# Patient Record
Sex: Female | Born: 2005 | Race: White | Hispanic: No | Marital: Single | Smoking: Never smoker
Health system: Southern US, Community
[De-identification: ages and names within clinical notes are randomized; demographics above are authoritative.]

## PROBLEM LIST (undated history)

## (undated) DIAGNOSIS — J45909 Unspecified asthma, uncomplicated: Secondary | ICD-10-CM

## (undated) DIAGNOSIS — E669 Obesity, unspecified: Secondary | ICD-10-CM

## (undated) DIAGNOSIS — K8020 Calculus of gallbladder without cholecystitis without obstruction: Secondary | ICD-10-CM

## (undated) DIAGNOSIS — K921 Melena: Secondary | ICD-10-CM

## (undated) DIAGNOSIS — R1013 Epigastric pain: Secondary | ICD-10-CM

## (undated) DIAGNOSIS — K5909 Other constipation: Secondary | ICD-10-CM

## (undated) DIAGNOSIS — R7303 Prediabetes: Secondary | ICD-10-CM

## (undated) HISTORY — DX: Melena: K92.1

## (undated) HISTORY — DX: Epigastric pain: R10.13

## (undated) HISTORY — DX: Other constipation: K59.09

---
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
## 2024-04-02 NOTE — Progress Notes (Signed)
 Chief Complaint:   Deborah Wang is a 18 y.o. [id number] female here for Follow-up (3 mo ocp check)   Referring provider: Verdon Heather Ruts*  History of Present Illness:   History of Present Illness Deborah Wang is an 18 year old female who presents with issues related to birth control patch adherence and effectiveness.  She experiences difficulties with the birth control patch, which pills up and detaches during showers or when she is hot and sweaty. Attempts to apply the patch to different areas, such as her arm and buttocks, have not improved adherence.  After discontinuing the patch, she had a spontaneous menstrual period, which was unexpected but welcome. This period occurred at a time that aligns with her typical cycle off birth control.  She wishes to resume the oral contraceptive pill, Junel, which she has used previously without experiencing headaches. Her current consistent schedule is expected to support adherence to the daily regimen. No recent headaches have been noted.      Results    See prior note for PCOS/irregular periods hx.  Sees pediatric endocrinology  Pertinent Hx: - Menarche: 9 - Cycles unpredictable, every 2-3 months. Period length: 4-7 days  - 12/2020 started on Loestrin to manage irregular periods - S/p dx laparoscopy and removal of bilateral paratubal cysts 01/2020 - S/p dx laparoscopy and removal of right paratubal cysts 02/2021   - PCOS - Hyperinsulinemia: 01/2020: 140 (H)  Birth control hx: -  - 12/2020 started on Loestrin to manage irregular periods - trial of patch in spring 2025 but would not stay on skin - return to Junel in 03/2024  Past Medical History:  has a past medical history of Acanthosis nigricans, Allergy (NA), Asthma, unspecified asthma severity, unspecified whether complicated, unspecified whether persistent (HHS-HCC), Constipation, Diabetes (CMS/HHS-HCC), GERD (gastroesophageal reflux disease) (NA), Hematochezia,  Hyperandrogenism, Hyperinsulinemia, and Vitamin D deficiency.  Past Surgical History:  has a past surgical history that includes tonsilectomy; Unlisted Procedure Pharynx/Adenoids/Tonsils; wisdom teeth; Cystectomy (02/10/2020); laparoscopic paratubal cyst (02/2021); and Tonsillectomy (NA). Family History: family history includes Asthma in her brother; Colon cancer in her maternal grandfather; Coronary Artery Disease (Blocked arteries around heart) in her paternal aunt; Diabetes in her maternal grandmother; Diabetes type II in her paternal grandmother; Heart disease in her brother, maternal grandfather, and mother; High blood pressure (Hypertension) in her father and mother; Lung cancer in her maternal grandfather; Osteoarthritis in her maternal grandmother; Rheum arthritis in her paternal grandmother; Stroke in her maternal aunt; Thyroid disease in her brother, father, maternal grandmother, and mother. Social History:  reports that she has never smoked. She has never used smokeless tobacco. She reports that she does not drink alcohol and does not use drugs. OB/GYN History:  OB History     Gravida  0   Para  0   Term  0   Preterm  0   AB  0   Living  0      SAB  0   IAB  0   Ectopic  0   Molar  0   Multiple  0   Live Births  0         Allergies: is allergic to amoxicillin. Medications: Current Outpatient Medications:  .  acetaminophen  (TYLENOL ) 325 MG tablet, Take by mouth every 6 (six) hours as needed, Disp: , Rfl:  .  norethindrone-ethinyl estradiol (JUNEL 1/20, 21,) 1-20 mg-mcg tablet, Take 1 tablet by mouth once daily FOR 21 DAYS NO DRUG FOR 7 DAYS THEN START NEW  PACK, Disp: 84 tablet, Rfl: 4 .  ACCU-CHEK FASTCLIX LANCET DRUM, USE TO CHECK GLUCOSE UP TO 8 TIMES A DAY. (Patient not taking: Reported on 04/02/2024), Disp: , Rfl:  .  ACCU-CHEK FASTCLIX LANCING DEV kit, USE WITH LANCETS FOR GLUCOSE CHECKS. PLEASE PROVIDE ONE FOR HOME AND ONE FOR SCHOOL. (Patient not taking:  Reported on 04/02/2024), Disp: , Rfl:  .  ACCU-CHEK GUIDE ME GLUCOSE MTR Misc, as directed (Patient not taking: Reported on 04/02/2024), Disp: , Rfl:  .  alcohol swabs PadM, Use as directed for glucose checks and insulin injections. (Patient not taking: Reported on 04/02/2024), Disp: , Rfl:  .  BD NANO 2ND GEN PEN NEEDLE 32 gauge x 5/32 Ndle, USE FOR DAILY INJECTION OF LIRAGLUTIDE (Patient not taking: Reported on 12/10/2023), Disp: , Rfl:  .  blood glucose diagnostic test strip, Use to check blood glucose 8 times a day. (Patient not taking: Reported on 04/02/2024), Disp: , Rfl:  .  blood glucose meter kit, Use as instructed to check blood sugar up to 8 times a day. Please provide one for home and one for school. (Patient not taking: Reported on 04/02/2024), Disp: , Rfl:  .  cyclobenzaprine  (FLEXERIL ) 5 MG tablet, Take 5 mg by mouth 3 (three) times daily as needed for Muscle spasms (Patient not taking: Reported on 04/02/2024), Disp: , Rfl:  .  metFORMIN (GLUCOPHAGE) 500 MG tablet, Take 1 tablet (500 mg total) by mouth once daily (Patient not taking: Reported on 11/29/2022), Disp: 90 tablet, Rfl: 3 .  norelgestromin-ethinyl estradiol (ORTHO EVRA) 150-35 mcg/24 hr patch, Place 1 patch onto the skin once a week (Patient not taking: Reported on 04/02/2024), Disp: 4 patch, Rfl: 11 .  ondansetron  (ZOFRAN -ODT) 4 MG disintegrating tablet, TAKE 1 TAB BY MOUTH EVERY 8 HOURS AS NEEDED FOR NAUSEA AND VOMITING (Patient not taking: Reported on 12/10/2023), Disp: , Rfl:  .  triamcinolone 0.1 % ointment, APPLY TO RASH ON HAND TWICE A DAY UNTIL RESOLVED. DO NOT APPLY TO FACE/GROIN/UNDERARMS. (Patient not taking: Reported on 12/10/2023), Disp: , Rfl:  .  VICTOZA 2-PAK 0.6 mg/0.1 mL (18 mg/3 mL) pen injector, INJECT 0.1 ML (0.6 MG) UNDER THE SKIN DAILY FOR 14 DAYS, THEN 0.2 ML (1.2 MG) DAILY FOR 14 DAYS (Patient not taking: Reported on 12/10/2023), Disp: , Rfl:   Review of Systems: No SOB, no palpitations or chest pain, no new lower  extremity edema, no nausea or vomiting or bowel or bladder complaints. See HPI for gyn specific ROS.   Exam:   Vitals:   04/02/24 1150  BP: 124/78     Constitutional:  General appearance: Well nourished, well developed female in no acute distress.  Neuro/psych:  Normal mood and affect. No gross motor deficits. Neck:  Supple, normal appearance.  Respiratory:  Normal respiratory effort, no use of accessory muscles Skin:  No visible rashes or external lesions       Impression:   The primary encounter diagnosis was Encounter for BCP (birth control pills) initial prescription. A diagnosis of PCOS (polycystic ovarian syndrome) was also pertinent to this visit.  Plan:   -  Assessment & Plan Contraceptive management She had issues with the transdermal patch and prefers to return to Junel 1/20 oral contraceptive pill. She understands the missed dose protocol and the need for additional STD protection. - Switch to Junel 1/20 oral contraceptive pill. - Instruct on proper use, including missed dose protocol. - Advise condom use for a week if pills are missed and she  has intercourse with a female partner. - Discuss lack of STD protection with the pill.  Menstrual cycle regulation She experienced a spontaneous menstrual period after discontinuing the patch, indicating some natural cycle regulation. - Monitor menstrual cycle on Junel 1/20 for regularity and issues.  Follow-up Follow-up in three months to evaluate response to contraceptive change and overall well-being. - Schedule follow-up in three months, with video visit option.    -   -   Diagnoses and all orders for this visit:  Encounter for BCP (birth control pills) initial prescription  PCOS (polycystic ovarian syndrome) -     norethindrone-ethinyl estradiol (JUNEL 1/20, 21,) 1-20 mg-mcg tablet; Take 1 tablet by mouth once daily FOR 21 DAYS NO DRUG FOR 7 DAYS THEN START NEW PACK     Return in about 3 months (around  07/03/2024) for Pill check.  BETHANY EVANGELINE BEASLEY, MD

## 2024-04-07 ENCOUNTER — Emergency Department
Admission: EM | Admit: 2024-04-07 | Discharge: 2024-04-07 | Disposition: A | Discharge status: Home / Self Care | Attending: Emergency Medicine | Admitting: Emergency Medicine

## 2024-04-07 ENCOUNTER — Other Ambulatory Visit: Payer: Self-pay

## 2024-04-07 DIAGNOSIS — U071 COVID-19: Secondary | ICD-10-CM | POA: Insufficient documentation

## 2024-04-07 DIAGNOSIS — R509 Fever, unspecified: Secondary | ICD-10-CM | POA: Diagnosis present

## 2024-04-07 DIAGNOSIS — E871 Hypo-osmolality and hyponatremia: Secondary | ICD-10-CM | POA: Diagnosis not present

## 2024-04-07 DIAGNOSIS — R000 Tachycardia, unspecified: Secondary | ICD-10-CM | POA: Diagnosis not present

## 2024-04-07 LAB — CBC WITH DIFFERENTIAL/PLATELET
Abs Immature Granulocytes: 0.06 10*3/uL (ref 0.00–0.07)
Basophils Absolute: 0.1 10*3/uL (ref 0.0–0.1)
Basophils Relative: 1 %
Eosinophils Absolute: 0 10*3/uL (ref 0.0–0.5)
Eosinophils Relative: 0 %
HCT: 42.2 % (ref 36.0–46.0)
Hemoglobin: 13.4 g/dL (ref 12.0–15.0)
Immature Granulocytes: 1 %
Lymphocytes Relative: 6 %
Lymphs Abs: 0.6 10*3/uL — ABNORMAL LOW (ref 0.7–4.0)
MCH: 25.1 pg — ABNORMAL LOW (ref 26.0–34.0)
MCHC: 31.8 g/dL (ref 30.0–36.0)
MCV: 79.2 fL — ABNORMAL LOW (ref 80.0–100.0)
Monocytes Absolute: 0.8 10*3/uL (ref 0.1–1.0)
Monocytes Relative: 8 %
Neutro Abs: 8.6 10*3/uL — ABNORMAL HIGH (ref 1.7–7.7)
Neutrophils Relative %: 84 %
Platelets: 285 10*3/uL (ref 150–400)
RBC: 5.33 MIL/uL — ABNORMAL HIGH (ref 3.87–5.11)
RDW: 13.6 % (ref 11.5–15.5)
WBC: 10.2 10*3/uL (ref 4.0–10.5)
nRBC: 0 % (ref 0.0–0.2)

## 2024-04-07 LAB — COMPREHENSIVE METABOLIC PANEL WITH GFR
ALT: 43 U/L (ref 0–44)
AST: 30 U/L (ref 15–41)
Albumin: 4.2 g/dL (ref 3.5–5.0)
Alkaline Phosphatase: 98 U/L (ref 38–126)
Anion gap: 10 (ref 5–15)
BUN: 9 mg/dL (ref 6–20)
CO2: 23 mmol/L (ref 22–32)
Calcium: 9.2 mg/dL (ref 8.9–10.3)
Chloride: 100 mmol/L (ref 98–111)
Creatinine, Ser: 0.64 mg/dL (ref 0.44–1.00)
GFR, Estimated: >60 mL/min (ref >60)
Glucose, Bld: 103 mg/dL — ABNORMAL HIGH (ref 70–99)
Potassium: 4 mmol/L (ref 3.5–5.1)
Sodium: 133 mmol/L — ABNORMAL LOW (ref 135–145)
Total Bilirubin: 0.7 mg/dL (ref 0.0–1.2)
Total Protein: 8.4 g/dL — ABNORMAL HIGH (ref 6.5–8.1)

## 2024-04-07 LAB — GROUP A STREP BY PCR: Group A Strep by PCR: NOT DETECTED

## 2024-04-07 LAB — RESP PANEL BY RT-PCR (RSV, FLU A&B, COVID)  RVPGX2
Influenza A by PCR: NEGATIVE
Influenza B by PCR: NEGATIVE
Resp Syncytial Virus by PCR: NEGATIVE
SARS Coronavirus 2 by RT PCR: POSITIVE — AB

## 2024-04-07 MED ORDER — ACETAMINOPHEN 500 MG PO TABS
1000.0000 mg | ORAL_TABLET | Freq: Once | ORAL | Status: AC
  Administered 2024-04-07: 1000 mg via ORAL
  Filled 2024-04-07: qty 2

## 2024-04-07 MED ORDER — IBUPROFEN 600 MG PO TABS
600.0000 mg | ORAL_TABLET | Freq: Once | ORAL | Status: AC
  Administered 2024-04-07: 600 mg via ORAL
  Filled 2024-04-07: qty 1

## 2024-04-07 MED ORDER — ONDANSETRON 4 MG PO TBDP: 4.0000 mg | ORAL_TABLET | Freq: Three times a day (TID) | ORAL | 0 refills | Status: AC | PRN

## 2024-04-07 MED ORDER — ONDANSETRON 4 MG PO TBDP
4.0000 mg | ORAL_TABLET | Freq: Once | ORAL | Status: AC
  Administered 2024-04-07: 4 mg via ORAL
  Filled 2024-04-07: qty 1

## 2024-04-07 NOTE — ED Notes (Signed)
Dr Arnoldo Morale with pt

## 2024-04-07 NOTE — ED Triage Notes (Signed)
 PT comes in via pov with complaints of emesis that started today. Pt states that she started off with a sore throat Sunday night, and started having a cough Sunday night, and began having vomiting this morning. Pt also complains of chills that come and go. Pt with no fever in triage. Pt tachycardic with HR 133.

## 2024-04-07 NOTE — ED Notes (Signed)
 Pt reports a sore throat and headache, cough.    No headache now. No fever.  Pt alert.  Pt in hallway bed.  Family with pt.

## 2024-04-07 NOTE — Discharge Instructions (Addendum)
 You are seen in emergency department for not feeling well with sore throat.  You tested positive for COVID.  You had a temperature of 103 in the emergency department.  You were given Tylenol  and Zofran .  It is important that you stay hydrated and drink plenty of fluids.  Hydrate orally with Pedialyte/Gatorade/Powerade for electrolytes.  You are given a prescription for nausea medication.  Return to the emergency department for any worsening symptoms.  Pain control:  Ibuprofen  (motrin /aleve/advil ) - You can take 3 tablets (600 mg) every 6 hours as needed for pain/fever.  Acetaminophen  (tylenol ) - You can take 2 extra strength tablets (1000 mg) every 6 hours as needed for pain/fever.  You can alternate these medications or take them together.  Make sure you eat food/drink water when taking these medications.

## 2024-04-07 NOTE — ED Notes (Signed)
 Meds given  pt drinking po  fluids.  Pt has a mask on.

## 2024-04-07 NOTE — ED Provider Notes (Signed)
 [facility] Provider Note    Event Date/Time   First MD Initiated Contact with Patient 04/07/24 1701     (approximate)   History   No chief complaint on file.   HPI  Deborah Wang is a 18 y.o. female presents to the emergency department for not feeling well.  States that she started having a headache on Sunday.  Then having a significant sore throat with some nausea and vomiting.  Denies any abdominal pain or diarrhea.  No dysuria.  Denies any concern for pregnancy.  Denies any significant cough or shortness of breath.     Physical Exam   Triage Vital Signs: ED Triage Vitals [04/07/24 1623]  Encounter Vitals Group     BP 121/70     Girls Systolic BP Percentile      Girls Diastolic BP Percentile      Boys Systolic BP Percentile      Boys Diastolic BP Percentile      Pulse Rate (!) 134     Resp 17     Temp 98.5 F (36.9 C)     Temp src      SpO2 98 %     Weight 283 lb (128.4 kg)     Height 5' 5 (1.651 m)     Head Circumference      Peak Flow      Pain Score 0     Pain Loc      Pain Education      Exclude from Growth Chart     Most recent vital signs: Vitals:   04/07/24 1830 04/07/24 1847  BP: 113/71   Pulse: (!) 115   Resp: 16   Temp:  (!) 102 F (38.9 C)  SpO2: 97%     Physical Exam Constitutional:      Appearance: She is well-developed.  HENT:     Head: Atraumatic.     Mouth/Throat:     Mouth: Mucous membranes are moist.     Pharynx: Oropharynx is clear.     Tonsils: No tonsillar exudate or tonsillar abscesses.   Eyes:     Conjunctiva/sclera: Conjunctivae normal.    Cardiovascular:     Rate and Rhythm: Regular rhythm. Tachycardia present.  Pulmonary:     Effort: No respiratory distress.  Abdominal:     General: There is no distension.     Tenderness: There is no abdominal tenderness.   Musculoskeletal:        General: Normal range of motion.     Cervical back: Normal range of motion.   Skin:    General:  Skin is warm.   Neurological:     Mental Status: She is alert. Mental status is at baseline.     IMPRESSION / MDM / ASSESSMENT AND PLAN / ED COURSE  I reviewed the triage vital signs and the nursing notes.  Differential diagnosis including viral pharyngitis, strep throat, viral illness including COVID/influenza, gastroenteritis, dehydration, electrolyte abnormality.  On arrival patient was afebrile and significantly tachycardic.  Repeated temperature and temperature was 103 with a heart rate of 122 with some tachypnea.   EKG  I, Clotilda Punter, the attending physician, personally viewed and interpreted this ECG.  Sinus tachycardia with a heart rate of 129.  Normal intervals.  No chamber enlargement.  No significant ST elevation or depression.  No findings of acute ischemia or dysrhythmia.  Sinus tachycardia while on cardiac telemetry.  LABS (all labs ordered are listed, but only abnormal  results are displayed) Labs interpreted as -    Labs Reviewed  RESP PANEL BY RT-PCR (RSV, FLU A&B, COVID)  RVPGX2 - Abnormal; Notable for the following components:      Result Value   SARS Coronavirus 2 by RT PCR POSITIVE (*)    All other components within normal limits  CBC WITH DIFFERENTIAL/PLATELET - Abnormal; Notable for the following components:   RBC 5.33 (*)    MCV 79.2 (*)    MCH 25.1 (*)    Neutro Abs 8.6 (*)    Lymphs Abs 0.6 (*)    All other components within normal limits  COMPREHENSIVE METABOLIC PANEL WITH GFR - Abnormal; Notable for the following components:   Sodium 133 (*)    Glucose, Bld 103 (*)    Total Protein 8.4 (*)    All other components within normal limits  GROUP A STREP BY PCR     MDM  COVID was positive.  Strep test was negative.  No significant leukocytosis.  Mild hyponatremia of 133 which is likely in the setting of volume depletion.  Given antipyretics and Zofran .  Will attempt oral hydration.  If continues to be tachycardic will give IV  fluids.  Continue to be febrile, given Motrin .  Patient states she is feeling much better.  She is tolerating p.o.  Gone to the bathroom multiple times.  States that she is feeling much better.  Continues to have some mild tachycardia and mild fever but otherwise is well-appearing.  States that she would return if she had any signs of dehydration.  Will call her in antiemetics.  Denies any burning with urination have low suspicion for urinary tract infection.  No questions at time of discharge.     PROCEDURES:  Critical Care performed: No  Procedures  Patient's presentation is most consistent with acute presentation with potential threat to life or bodily function.   MEDICATIONS ORDERED IN ED: Medications  acetaminophen  (TYLENOL ) tablet 1,000 mg (1,000 mg Oral Given 04/07/24 1739)  ondansetron  (ZOFRAN -ODT) disintegrating tablet 4 mg (4 mg Oral Given 04/07/24 1740)  ibuprofen  (ADVIL ) tablet 600 mg (600 mg Oral Given 04/07/24 1847)    FINAL CLINICAL IMPRESSION(S) / ED DIAGNOSES   Final diagnoses:  COVID  Fever, unspecified fever cause     Rx / DC Orders   ED Discharge Orders          Ordered    ondansetron  (ZOFRAN -ODT) 4 MG disintegrating tablet  Every 8 hours PRN        04/07/24 1736             Note:  This document was prepared using Dragon voice recognition software and may include unintentional dictation errors.   Suzanne Kirsch, MD 04/07/24 TYRA

## 2024-08-06 NOTE — Progress Notes (Signed)
 Chief Complaint:   Ms. Cavenaugh is a 18 y.o. G0P0000 female here for Follow-up (OCP follow up - feels it hasn't been any better than the patch because she forgets to take it some days, forgetting to take it twice a week at least )   Referring provider: Self  History of Present Illness:  Contraception: - orthoevra patch trial- did not stick to skin - oral OCPs Junel 1/20 in 03/2024 to regular period but she forgets  Hx of PCOS, prior metformin use,   History of Present Illness Deborah Wang is a 18 year old female who presents with concerns about contraception and mood changes.  Contraceptive management - Initial use of Ortho Evra patch, which failed to adhere to skin - Transitioned to Junel 1/20 oral contraceptive - Inconsistent use of oral contraceptive due to forgetfulness  Menstrual irregularity - Amenorrhea for seven years  Mood disturbance - Significant irritability and easy aggravation, partially attributed to stress - No depression - Normal sleep patterns  Access to mental health care - Attempted therapy but unable to find a therapist who accepts her insurance     Past Medical History:  has a past medical history of Acanthosis nigricans, Allergy (NA), Asthma, unspecified asthma severity, unspecified whether complicated, unspecified whether persistent (HHS-HCC), Constipation, Diabetes (CMS/HHS-HCC), GERD (gastroesophageal reflux disease) (NA), Hematochezia, Hyperandrogenism, Hyperinsulinemia, and Vitamin D deficiency.  Past Surgical History:  has a past surgical history that includes tonsilectomy; Unlisted Procedure Pharynx/Adenoids/Tonsils; wisdom teeth; Cystectomy (02/10/2020); laparoscopic paratubal cyst (02/2021); and Tonsillectomy (NA). Family History: family history includes Asthma in her brother; Colon cancer in her maternal grandfather; Coronary Artery Disease (Blocked arteries around heart) in her paternal aunt; Diabetes in her maternal grandmother; Diabetes type  II in her paternal grandmother; Heart disease in her brother, maternal grandfather, and mother; High blood pressure (Hypertension) in her father and mother; Lung cancer in her maternal grandfather; Osteoarthritis in her maternal grandmother; Rheum arthritis in her paternal grandmother; Stroke in her maternal aunt; Thyroid disease in her brother, father, maternal grandmother, and mother. Social History:  reports that she has never smoked. She has never used smokeless tobacco. She reports that she does not drink alcohol and does not use drugs. OB/GYN History:  OB History     Gravida  0   Para  0   Term  0   Preterm  0   AB  0   Living  0      SAB  0   IAB  0   Ectopic  0   Molar  0   Multiple  0   Live Births  0         Allergies: is allergic to amoxicillin. Medications: Current Outpatient Medications:  .  acetaminophen  (TYLENOL ) 325 MG tablet, Take by mouth every 6 (six) hours as needed, Disp: , Rfl:  .  norethindrone-ethinyl estradiol (JUNEL 1/20, 21,) 1-20 mg-mcg tablet, Take 1 tablet by mouth once daily FOR 21 DAYS NO DRUG FOR 7 DAYS THEN START NEW PACK, Disp: 84 tablet, Rfl: 4 .  metFORMIN (GLUCOPHAGE) 500 MG tablet, Take 1 tablet (500 mg total) by mouth once daily (Patient not taking: Reported on 11/29/2022), Disp: 90 tablet, Rfl: 3 .  sertraline (ZOLOFT) 50 MG tablet, Take 1/2 tablet daily x 1 week then increase to 1 tablet daily, Disp: 90 tablet, Rfl: 1  Review of Systems: No SOB, no palpitations or chest pain, no new lower extremity edema, no nausea or vomiting or bowel or bladder complaints. See  HPI for gyn specific ROS.   Exam:   Vitals:   08/06/24 1558  BP: 126/74     Constitutional:  General appearance: Well nourished, well developed female in no acute distress.  Neuro/psych:  Normal mood and affect. No gross motor deficits. Neck:  Supple, normal appearance.  Respiratory:  Normal respiratory effort, no use of accessory muscles Skin:  No visible  rashes or external lesions       Impression:   The primary encounter diagnosis was Irritability. A diagnosis of PCOS (polycystic ovarian syndrome) was also pertinent to this visit.  Plan:   -  Assessment & Plan Polycystic Ovarian Syndrome (PCOS) with secondary amenorrhea PCOS with secondary amenorrhea for seven years. Inducing a period at least four times a year is crucial to prevent endometrial hyperplasia and potential progression to endometrial cancer. The uterine lining needs periodic shedding to prevent unregulated growth. - Prescribe a 10-day course of progesterone to induce a period, to be taken at her convenience.  Contraceptive management Currently not on a consistent contraceptive method due to difficulty remembering to take oral contraceptives. Previously tried the patch, which did not adhere well. Interested in continuing with oral contraceptives but struggles with adherence. Concerned about the ring getting lost and potential weight gain with the shot. - Continue with oral contraceptive pills and set a consistent schedule for taking them, possibly using an alarm or associating with a daily routine like brushing teeth. - Provide a new prescription for oral contraceptives. - Consider the ring as a future option if adherence to pills remains challenging.  Mood symptoms (irritability) Experiencing increased irritability and snappiness, possibly related to stress and lack of personal time. No signs of depression. Potential role of neurotransmitter depletion due to stress. Difficulty finding a therapist due to insurance issues. - Provide an online resource to find therapists in her area. - Prescribe a medication for mood stabilization. Zoloft 50mg  - Instruct to keep a symptom diary to monitor mood changes and any side effects from medication. - Schedule a follow-up in two months to assess the effectiveness of the medication and mood improvement.     -   -   Diagnoses and all  orders for this visit:  Irritability -     sertraline (ZOLOFT) 50 MG tablet; Take 1/2 tablet daily x 1 week then increase to 1 tablet daily  PCOS (polycystic ovarian syndrome) -     norethindrone-ethinyl estradiol (JUNEL 1/20, 21,) 1-20 mg-mcg tablet; Take 1 tablet by mouth once daily FOR 21 DAYS NO DRUG FOR 7 DAYS THEN START NEW PACK     Return in about 3 months (around 11/06/2024) for follow up - med check.  BETHANY EVANGELINE BEASLEY, MD

## 2024-08-25 ENCOUNTER — Emergency Department

## 2024-08-25 ENCOUNTER — Encounter
Admission: EM | Disposition: A | Discharge status: Home / Self Care | Payer: Self-pay | Source: Home / Self Care | Attending: Emergency Medicine

## 2024-08-25 ENCOUNTER — Other Ambulatory Visit: Payer: Self-pay

## 2024-08-25 ENCOUNTER — Observation Stay
Admission: EM | Admit: 2024-08-25 | Discharge: 2024-08-26 | Disposition: A | Discharge status: Home / Self Care | Attending: General Surgery | Admitting: General Surgery

## 2024-08-25 ENCOUNTER — Observation Stay: Admitting: Anesthesiology

## 2024-08-25 DIAGNOSIS — K8012 Calculus of gallbladder with acute and chronic cholecystitis without obstruction: Secondary | ICD-10-CM | POA: Diagnosis not present

## 2024-08-25 DIAGNOSIS — J45909 Unspecified asthma, uncomplicated: Secondary | ICD-10-CM | POA: Insufficient documentation

## 2024-08-25 DIAGNOSIS — Z6841 Body Mass Index (BMI) 40.0 and over, adult: Secondary | ICD-10-CM | POA: Diagnosis not present

## 2024-08-25 DIAGNOSIS — K810 Acute cholecystitis: Principal | ICD-10-CM | POA: Diagnosis present

## 2024-08-25 DIAGNOSIS — R1011 Right upper quadrant pain: Secondary | ICD-10-CM | POA: Diagnosis present

## 2024-08-25 HISTORY — PX: INDOCYANINE GREEN FLUORESCENCE IMAGING (ICG): SHX7595

## 2024-08-25 LAB — URINALYSIS, ROUTINE W REFLEX MICROSCOPIC
Bilirubin Urine: NEGATIVE
Glucose, UA: NEGATIVE mg/dL
Ketones, ur: NEGATIVE mg/dL
Leukocytes,Ua: NEGATIVE
Nitrite: NEGATIVE
Protein, ur: 30 mg/dL — AB
Specific Gravity, Urine: 1.031 — ABNORMAL HIGH (ref 1.005–1.030)
pH: 6 (ref 5.0–8.0)

## 2024-08-25 LAB — COMPREHENSIVE METABOLIC PANEL WITH GFR
ALT: 39 U/L (ref 0–44)
AST: 30 U/L (ref 15–41)
Albumin: 3.8 g/dL (ref 3.5–5.0)
Alkaline Phosphatase: 92 U/L (ref 38–126)
Anion gap: 11 (ref 5–15)
BUN: 10 mg/dL (ref 6–20)
CO2: 22 mmol/L (ref 22–32)
Calcium: 8.9 mg/dL (ref 8.9–10.3)
Chloride: 106 mmol/L (ref 98–111)
Creatinine, Ser: 0.49 mg/dL (ref 0.44–1.00)
GFR, Estimated: >60 mL/min (ref >60)
Glucose, Bld: 122 mg/dL — ABNORMAL HIGH (ref 70–99)
Potassium: 3.8 mmol/L (ref 3.5–5.1)
Sodium: 139 mmol/L (ref 135–145)
Total Bilirubin: 0.4 mg/dL (ref 0.0–1.2)
Total Protein: 7.9 g/dL (ref 6.5–8.1)

## 2024-08-25 LAB — CBC
HCT: 40.5 % (ref 36.0–46.0)
Hemoglobin: 12.9 g/dL (ref 12.0–15.0)
MCH: 25.4 pg — ABNORMAL LOW (ref 26.0–34.0)
MCHC: 31.9 g/dL (ref 30.0–36.0)
MCV: 79.7 fL — ABNORMAL LOW (ref 80.0–100.0)
Platelets: 354 K/uL (ref 150–400)
RBC: 5.08 MIL/uL (ref 3.87–5.11)
RDW: 13.6 % (ref 11.5–15.5)
WBC: 14.4 K/uL — ABNORMAL HIGH (ref 4.0–10.5)
nRBC: 0 % (ref 0.0–0.2)

## 2024-08-25 LAB — LIPASE, BLOOD: Lipase: 40 U/L (ref 11–51)

## 2024-08-25 LAB — POC URINE PREG, ED: Preg Test, Ur: NEGATIVE

## 2024-08-25 SURGERY — CHOLECYSTECTOMY, ROBOT-ASSISTED, LAPAROSCOPIC
Anesthesia: General

## 2024-08-25 MED ORDER — ROCURONIUM BROMIDE 100 MG/10ML IV SOLN
INTRAVENOUS | Status: DC | PRN
  Administered 2024-08-25: 30 mg via INTRAVENOUS
  Administered 2024-08-25: 50 mg via INTRAVENOUS

## 2024-08-25 MED ORDER — PROPOFOL 10 MG/ML IV BOLUS
INTRAVENOUS | Status: AC
  Filled 2024-08-25: qty 20

## 2024-08-25 MED ORDER — OXYCODONE HCL 5 MG/5ML PO SOLN: 5.0000 mg | Freq: Once | ORAL | Status: AC | PRN

## 2024-08-25 MED ORDER — CIPROFLOXACIN IN D5W 400 MG/200ML IV SOLN
400.0000 mg | Freq: Two times a day (BID) | INTRAVENOUS | Status: DC
  Administered 2024-08-25 – 2024-08-26 (×3): 400 mg via INTRAVENOUS
  Filled 2024-08-25 (×4): qty 200

## 2024-08-25 MED ORDER — DEXMEDETOMIDINE HCL IN NACL 80 MCG/20ML IV SOLN
INTRAVENOUS | Status: DC | PRN
  Administered 2024-08-25: 12 ug via INTRAVENOUS
  Administered 2024-08-25: 4 ug via INTRAVENOUS
  Administered 2024-08-25: 8 ug via INTRAVENOUS
  Administered 2024-08-25 (×2): 4 ug via INTRAVENOUS

## 2024-08-25 MED ORDER — FENTANYL CITRATE (PF) 100 MCG/2ML IJ SOLN
INTRAMUSCULAR | Status: AC
  Filled 2024-08-25: qty 2

## 2024-08-25 MED ORDER — ONDANSETRON HCL 4 MG/2ML IJ SOLN
INTRAMUSCULAR | Status: AC
  Filled 2024-08-25: qty 2

## 2024-08-25 MED ORDER — SUCCINYLCHOLINE CHLORIDE 200 MG/10ML IV SOSY
PREFILLED_SYRINGE | INTRAVENOUS | Status: DC | PRN
  Administered 2024-08-25: 140 mg via INTRAVENOUS

## 2024-08-25 MED ORDER — ACETAMINOPHEN 10 MG/ML IV SOLN
INTRAVENOUS | Status: AC
  Filled 2024-08-25: qty 100

## 2024-08-25 MED ORDER — MORPHINE SULFATE (PF) 4 MG/ML IV SOLN
4.0000 mg | INTRAVENOUS | Status: DC | PRN
  Administered 2024-08-25: 4 mg via INTRAVENOUS
  Filled 2024-08-25: qty 1

## 2024-08-25 MED ORDER — OXYCODONE HCL 5 MG PO TABS
ORAL_TABLET | ORAL | Status: AC
  Filled 2024-08-25: qty 1

## 2024-08-25 MED ORDER — INDOCYANINE GREEN 25 MG IV SOLR
INTRAVENOUS | Status: AC
  Filled 2024-08-25: qty 10

## 2024-08-25 MED ORDER — PROPOFOL 10 MG/ML IV BOLUS
INTRAVENOUS | Status: DC | PRN
Start: 2024-08-25 — End: 2024-08-25
  Administered 2024-08-25: 20 mg via INTRAVENOUS
  Administered 2024-08-25: 250 mg via INTRAVENOUS
  Administered 2024-08-25: 20 ug/kg/min via INTRAVENOUS

## 2024-08-25 MED ORDER — CEFAZOLIN SODIUM-DEXTROSE 3-4 GM/150ML-% IV SOLN
3.0000 g | INTRAVENOUS | Status: AC
  Administered 2024-08-25: 3 g via INTRAVENOUS
  Filled 2024-08-25: qty 150

## 2024-08-25 MED ORDER — LIDOCAINE HCL (CARDIAC) PF 100 MG/5ML IV SOSY
PREFILLED_SYRINGE | INTRAVENOUS | Status: DC | PRN
  Administered 2024-08-25: 100 mg via INTRAVENOUS

## 2024-08-25 MED ORDER — DEXAMETHASONE SOD PHOSPHATE PF 10 MG/ML IJ SOLN
INTRAMUSCULAR | Status: DC | PRN
  Administered 2024-08-25: 10 mg via INTRAVENOUS

## 2024-08-25 MED ORDER — LACTATED RINGERS IV SOLN: INTRAVENOUS | Status: DC | PRN

## 2024-08-25 MED ORDER — MIDAZOLAM HCL 2 MG/2ML IJ SOLN
INTRAMUSCULAR | Status: AC
  Filled 2024-08-25: qty 2

## 2024-08-25 MED ORDER — CEFAZOLIN SODIUM-DEXTROSE 2-4 GM/100ML-% IV SOLN
INTRAVENOUS | Status: AC
  Filled 2024-08-25: qty 100

## 2024-08-25 MED ORDER — METRONIDAZOLE 500 MG/100ML IV SOLN
500.0000 mg | Freq: Two times a day (BID) | INTRAVENOUS | Status: DC
  Administered 2024-08-25 – 2024-08-26 (×3): 500 mg via INTRAVENOUS
  Filled 2024-08-25 (×4): qty 100

## 2024-08-25 MED ORDER — SUGAMMADEX SODIUM 200 MG/2ML IV SOLN
INTRAVENOUS | Status: DC | PRN
  Administered 2024-08-25: 280 mg via INTRAVENOUS

## 2024-08-25 MED ORDER — ONDANSETRON HCL 4 MG/2ML IJ SOLN
INTRAMUSCULAR | Status: DC | PRN
  Administered 2024-08-25: 4 mg via INTRAVENOUS

## 2024-08-25 MED ORDER — HYDROCODONE-ACETAMINOPHEN 5-325 MG PO TABS
1.0000 | ORAL_TABLET | ORAL | Status: DC | PRN
  Administered 2024-08-25 – 2024-08-26 (×2): 2 via ORAL
  Filled 2024-08-25 (×2): qty 2

## 2024-08-25 MED ORDER — ACETAMINOPHEN 325 MG PO TABS: 650.0000 mg | ORAL_TABLET | Freq: Four times a day (QID) | ORAL | Status: DC | PRN

## 2024-08-25 MED ORDER — INDOCYANINE GREEN 25 MG IV SOLR
1.2500 mg | Freq: Once | INTRAVENOUS | Status: AC
  Administered 2024-08-25: 1.25 mg via INTRAVENOUS

## 2024-08-25 MED ORDER — ACETAMINOPHEN 10 MG/ML IV SOLN
1000.0000 mg | Freq: Once | INTRAVENOUS | Status: AC
  Administered 2024-08-25: 1000 mg via INTRAVENOUS

## 2024-08-25 MED ORDER — ONDANSETRON HCL 4 MG/2ML IJ SOLN: 4.0000 mg | Freq: Four times a day (QID) | INTRAMUSCULAR | Status: DC | PRN

## 2024-08-25 MED ORDER — ROCURONIUM BROMIDE 10 MG/ML (PF) SYRINGE
PREFILLED_SYRINGE | INTRAVENOUS | Status: AC
  Filled 2024-08-25: qty 10

## 2024-08-25 MED ORDER — NAPROXEN 500 MG PO TABS
500.0000 mg | ORAL_TABLET | Freq: Once | ORAL | Status: AC
  Administered 2024-08-25: 500 mg via ORAL
  Filled 2024-08-25: qty 1

## 2024-08-25 MED ORDER — MIDAZOLAM HCL (PF) 2 MG/2ML IJ SOLN
INTRAMUSCULAR | Status: DC | PRN
  Administered 2024-08-25: 2 mg via INTRAVENOUS

## 2024-08-25 MED ORDER — KETOROLAC TROMETHAMINE 30 MG/ML IJ SOLN
INTRAMUSCULAR | Status: DC | PRN
  Administered 2024-08-25: 15 mg via INTRAVENOUS

## 2024-08-25 MED ORDER — PHENYLEPHRINE 80 MCG/ML (10ML) SYRINGE FOR IV PUSH (FOR BLOOD PRESSURE SUPPORT)
PREFILLED_SYRINGE | INTRAVENOUS | Status: DC | PRN
  Administered 2024-08-25: 80 ug via INTRAVENOUS

## 2024-08-25 MED ORDER — ACETAMINOPHEN 650 MG RE SUPP
650.0000 mg | Freq: Four times a day (QID) | RECTAL | Status: DC | PRN
Start: 2024-08-25 — End: 2024-08-26

## 2024-08-25 MED ORDER — BUPIVACAINE-EPINEPHRINE 0.25% -1:200000 IJ SOLN
INTRAMUSCULAR | Status: DC | PRN
  Administered 2024-08-25: 30 mL

## 2024-08-25 MED ORDER — CALCIUM CARBONATE ANTACID 500 MG PO CHEW
1.0000 | CHEWABLE_TABLET | Freq: Once | ORAL | Status: AC
  Administered 2024-08-25: 200 mg via ORAL
  Filled 2024-08-25: qty 1

## 2024-08-25 MED ORDER — ONDANSETRON 4 MG PO TBDP: 4.0000 mg | ORAL_TABLET | Freq: Four times a day (QID) | ORAL | Status: DC | PRN

## 2024-08-25 MED ORDER — ESMOLOL HCL 100 MG/10ML IV SOLN
INTRAVENOUS | Status: DC | PRN
  Administered 2024-08-25 (×3): 5 mg via INTRAVENOUS

## 2024-08-25 MED ORDER — OXYCODONE HCL 5 MG PO TABS
5.0000 mg | ORAL_TABLET | Freq: Once | ORAL | Status: AC | PRN
  Administered 2024-08-25: 5 mg via ORAL

## 2024-08-25 MED ORDER — FENTANYL CITRATE (PF) 100 MCG/2ML IJ SOLN
INTRAMUSCULAR | Status: DC | PRN
  Administered 2024-08-25 (×4): 50 ug via INTRAVENOUS

## 2024-08-25 MED ORDER — ESMOLOL HCL 100 MG/10ML IV SOLN
INTRAVENOUS | Status: AC
  Filled 2024-08-25: qty 10

## 2024-08-25 MED ORDER — BUPIVACAINE-EPINEPHRINE (PF) 0.25% -1:200000 IJ SOLN
INTRAMUSCULAR | Status: AC
  Filled 2024-08-25: qty 30

## 2024-08-25 MED ORDER — PROPOFOL 1000 MG/100ML IV EMUL
INTRAVENOUS | Status: AC
  Filled 2024-08-25: qty 100

## 2024-08-25 MED ORDER — FENTANYL CITRATE (PF) 100 MCG/2ML IJ SOLN
25.0000 ug | INTRAMUSCULAR | Status: DC | PRN
  Administered 2024-08-25: 25 ug via INTRAVENOUS
  Administered 2024-08-25: 50 ug via INTRAVENOUS
  Administered 2024-08-25: 25 ug via INTRAVENOUS

## 2024-08-25 MED ORDER — LIDOCAINE HCL (PF) 2 % IJ SOLN
INTRAMUSCULAR | Status: AC
  Filled 2024-08-25: qty 5

## 2024-08-25 MED ORDER — ENOXAPARIN SODIUM 80 MG/0.8ML IJ SOSY
65.0000 mg | PREFILLED_SYRINGE | INTRAMUSCULAR | Status: DC
  Administered 2024-08-25 – 2024-08-26 (×2): 65 mg via SUBCUTANEOUS
  Filled 2024-08-25 (×2): qty 0.65

## 2024-08-25 SURGICAL SUPPLY — 41 items
BAG PRESSURE INF REUSE 1000 (BAG) IMPLANT
CANNULA REDUCER 12-8 DVNC XI (CANNULA) ×2 IMPLANT
CAUTERY HOOK MNPLR 1.6 DVNC XI (INSTRUMENTS) ×2 IMPLANT
CLIP LIGATING HEM O LOK PURPLE (MISCELLANEOUS) IMPLANT
CLIP LIGATING HEMO O LOK GREEN (MISCELLANEOUS) ×2 IMPLANT
DEFOGGER SCOPE WARM SEASHARP (MISCELLANEOUS) ×2 IMPLANT
DERMABOND ADVANCED .7 DNX12 (GAUZE/BANDAGES/DRESSINGS) ×2 IMPLANT
DRAPE ARM DVNC X/XI (DISPOSABLE) ×8 IMPLANT
DRAPE C-ARM XRAY 36X54 (DRAPES) IMPLANT
DRAPE COLUMN DVNC XI (DISPOSABLE) ×2 IMPLANT
ELECTRODE REM PT RTRN 9FT ADLT (ELECTROSURGICAL) ×2 IMPLANT
FORCEPS BPLR 8 MD DVNC XI (FORCEP) IMPLANT
FORCEPS BPLR FENES DVNC XI (FORCEP) ×2 IMPLANT
FORCEPS PROGRASP DVNC XI (FORCEP) ×2 IMPLANT
GLOVE BIO SURGEON STRL SZ 6.5 (GLOVE) ×4 IMPLANT
GLOVE BIOGEL PI IND STRL 6.5 (GLOVE) ×4 IMPLANT
GLOVE SURG SYN 6.5 PF PI (GLOVE) ×4 IMPLANT
GOWN STRL REUS W/ TWL LRG LVL3 (GOWN DISPOSABLE) ×8 IMPLANT
GRASPER SUT TROCAR 14GX15 (MISCELLANEOUS) ×2 IMPLANT
IRRIGATOR SUCT 8 DISP DVNC XI (IRRIGATION / IRRIGATOR) IMPLANT
IV 0.9% NACL 1000 ML (IV SOLUTION) IMPLANT
IV CATH ANGIO 12GX3 LT BLUE (NEEDLE) IMPLANT
KIT PINK PAD W/HEAD ARM REST (MISCELLANEOUS) ×2 IMPLANT
LABEL OR SOLS (LABEL) ×2 IMPLANT
MANIFOLD NEPTUNE II (INSTRUMENTS) IMPLANT
NDL HYPO 22X1.5 SAFETY MO (MISCELLANEOUS) ×2 IMPLANT
NDL INSUFFLATION 14GA 120MM (NEEDLE) ×2 IMPLANT
NEEDLE HYPO 22X1.5 SAFETY MO (MISCELLANEOUS) ×2 IMPLANT
NEEDLE INSUFFLATION 14GA 120MM (NEEDLE) ×2 IMPLANT
NS IRRIG 500ML POUR BTL (IV SOLUTION) ×2 IMPLANT
OBTURATOR OPTICALSTD 8 DVNC (TROCAR) ×2 IMPLANT
PACK LAP CHOLECYSTECTOMY (MISCELLANEOUS) ×2 IMPLANT
SEAL UNIV 5-12 XI (MISCELLANEOUS) ×8 IMPLANT
SET TUBE SMOKE EVAC HIGH FLOW (TUBING) ×2 IMPLANT
SOLUTION ELECTROSURG ANTI STCK (MISCELLANEOUS) ×2 IMPLANT
SPIKE FLUID TRANSFER (MISCELLANEOUS) ×4 IMPLANT
SPONGE T-LAP 4X18 ~~LOC~~+RFID (SPONGE) IMPLANT
SUT VICRYL 0 UR6 27IN ABS (SUTURE) ×2 IMPLANT
SUTURE MNCRL 4-0 27XMF (SUTURE) ×2 IMPLANT
SYSTEM BAG RETRIEVAL 10MM (BASKET) ×2 IMPLANT
WATER STERILE IRR 500ML POUR (IV SOLUTION) ×2 IMPLANT

## 2024-08-25 NOTE — Plan of Care (Signed)

## 2024-08-25 NOTE — Anesthesia Preprocedure Evaluation (Signed)
 Anesthesia Evaluation  Patient identified by MRN, date of birth, ID band Patient awake    Reviewed: Allergy & Precautions, NPO status , Patient's Chart, lab work & pertinent test results  History of Anesthesia Complications Negative for: history of anesthetic complications  Airway Mallampati: III  TM Distance: <3 FB Neck ROM: full    Dental  (+) Chipped   Pulmonary neg shortness of breath, asthma    Pulmonary exam normal        Cardiovascular Exercise Tolerance: Good (-) angina negative cardio ROS Normal cardiovascular exam     Neuro/Psych negative neurological ROS  negative psych ROS   GI/Hepatic negative GI ROS, Neg liver ROS,neg GERD  ,,  Endo/Other  negative endocrine ROS    Renal/GU      Musculoskeletal   Abdominal   Peds  Hematology negative hematology ROS (+)   Anesthesia Other Findings Past Medical History: No date: Abdominal pain, epigastric No date: Asthma No date: Constipation, chronic No date: Gallstones No date: Hematochezia No date: Obesity No date: Prediabetes     Comment:  H/O NO MEDS  Past Surgical History: 04/15/2018: TONSILLECTOMY AND ADENOIDECTOMY; Bilateral     Comment:  Procedure: TONSILLECTOMY AND ADENOIDECTOMY;  Surgeon:               Herminio Miu, MD;  Location: [facility] ORS;  Service: ENT;              Laterality: Bilateral;  BMI    Body Mass Index: 47.43 kg/m      Reproductive/Obstetrics negative OB ROS                              Anesthesia Physical Anesthesia Plan  ASA: 3  Anesthesia Plan: General ETT   Post-op Pain Management:    Induction: Intravenous  PONV Risk Score and Plan: Ondansetron , Dexamethasone , Midazolam  and Treatment may vary due to age or medical condition  Airway Management Planned: Oral ETT and Video Laryngoscope Planned  Additional Equipment:   Intra-op Plan:   Post-operative Plan: Extubation in OR  Informed  Consent: I have reviewed the patients History and Physical, chart, labs and discussed the procedure including the risks, benefits and alternatives for the proposed anesthesia with the patient or authorized representative who has indicated his/her understanding and acceptance.     Dental Advisory Given  Plan Discussed with: Anesthesiologist, CRNA and Surgeon  Anesthesia Plan Comments: (Patient consented for risks of anesthesia including but not limited to:  - adverse reactions to medications - damage to eyes, teeth, lips or other oral mucosa - nerve damage due to positioning  - sore throat or hoarseness - Damage to heart, brain, nerves, lungs, other parts of body or loss of life  Patient voiced understanding and assent.)        Anesthesia Quick Evaluation

## 2024-08-25 NOTE — Anesthesia Procedure Notes (Signed)
 Procedure Name: Intubation Date/Time: 08/25/2024 1:14 PM  Performed by: Dyane Mass, CRNAPre-anesthesia Checklist: Patient identified, Emergency Drugs available, Suction available and Patient being monitored Patient Re-evaluated:Patient Re-evaluated prior to induction Oxygen Delivery Method: Circle system utilized Preoxygenation: Pre-oxygenation with 100% oxygen Induction Type: IV induction and Rapid sequence Laryngoscope Size: McGrath and 3 Grade View: Grade I Tube type: Oral Tube size: 7.0 mm Number of attempts: 1 Airway Equipment and Method: Stylet Placement Confirmation: ETT inserted through vocal cords under direct vision, positive ETCO2 and breath sounds checked- equal and bilateral Secured at: 22 cm Tube secured with: Tape Dental Injury: Teeth and Oropharynx as per pre-operative assessment

## 2024-08-25 NOTE — Transfer of Care (Signed)
 Immediate Anesthesia Transfer of Care Note  Patient: Deborah Wang  Procedure(s) Performed: CHOLECYSTECTOMY, ROBOT-ASSISTED, LAPAROSCOPIC INDOCYANINE GREEN FLUORESCENCE IMAGING (ICG)  Patient Location: PACU  Anesthesia Type:General  Level of Consciousness: oriented and drowsy  Airway & Oxygen Therapy: Patient Spontanous Breathing and Patient connected to face mask oxygen  Post-op Assessment: Report given to RN and Post -op Vital signs reviewed and stable  Post vital signs: Reviewed and stable  Last Vitals:  Vitals Value Taken Time  BP 139/95 08/25/24 14:46  Temp    Pulse 98 08/25/24 14:48  Resp 18 08/25/24 14:48  SpO2 97 % 08/25/24 14:48  Vitals shown include unfiled device data.  Last Pain:  Vitals:   08/25/24 1243  TempSrc:   PainSc: 0-No pain         Complications: No notable events documented.

## 2024-08-25 NOTE — H&P (Signed)
 [facility]- General Surgery SURGICAL HISTORY & PHYSICAL   HISTORY OF PRESENT ILLNESS (HPI):  18 y.o. female presented to [facility] ED last night for right upper quadrant pain. Patient reports pain started last night around 8 PM after dinner.  She thought it was acid reflux and took Tums and Pepto but did not notice any improvement.  Pain was constant and was radiating to back.  Pain was associated with nausea and vomiting. No known alleviating or aggravating factors.  Patient has a surgical history of diagnostic laparoscopy and removal of paratubal cyst in 2021 and 2022.   In the ED, patient was afebrile and normotensive with BP 121/70 and HR of 134.  Labs indicated leukocytosis 14.4.  CMP indicated normal LFTs, alkaline phos, and total bili.  No electrolyte disturbance noted.  Lipase normal at 40.  Ultrasound of abdomen showed gallbladder dilation with a 1.6 cm neck stone and positive Murphy sign.  No gallbladder wall thickening or pericystic fluid.  CBD measuring 2.4 mm.  Findings consistent with acute cholecystitis.  No concerns for choledocholithiasis or acute cholangitis.  PAST MEDICAL HISTORY (PMH):  Past Medical History:  Diagnosis Date   Abdominal pain, epigastric    Asthma    Constipation, chronic    Gallstones    Hematochezia    Obesity    Prediabetes    H/O NO MEDS    Reviewed. Otherwise negative.   PAST SURGICAL HISTORY [facility]):  Past Surgical History:  Procedure Laterality Date   TONSILLECTOMY AND ADENOIDECTOMY Bilateral 04/15/2018   Procedure: TONSILLECTOMY AND ADENOIDECTOMY;  Surgeon: Herminio Miu, MD;  Location: [facility] ORS;  Service: ENT;  Laterality: Bilateral;    Reviewed. Otherwise negative.   MEDICATIONS:  Prior to Admission medications   Medication Sig Start Date End Date Taking? Authorizing Provider  liraglutide (VICTOZA) 18 MG/3ML SOPN Inject 0.1 mLs into the skin once a week. 12/26/23  Yes [provider]  sertraline (ZOLOFT) 50 MG tablet Take 50 mg by  mouth daily. 08/06/24  Yes [provider]  albuterol (PROVENTIL HFA;VENTOLIN HFA) 108 (90 Base) MCG/ACT inhaler Inhale 2 puffs into the lungs every 6 (six) hours as needed for wheezing or shortness of breath.    [provider]  cyclobenzaprine  (FLEXERIL ) 5 MG tablet Take 1 tablet (5 mg total) by mouth 3 (three) times daily as needed for muscle spasms. 12/23/23   Suzanne Kirsch, MD  ondansetron  (ZOFRAN -ODT) 4 MG disintegrating tablet Take 1 tablet (4 mg total) by mouth every 8 (eight) hours as needed for nausea or vomiting. 04/07/24   Suzanne Kirsch, MD     ALLERGIES:  Allergies  Allergen Reactions   Amoxicillin Rash     SOCIAL HISTORY:  Social History   Socioeconomic History   Marital status: Single    Spouse name: Not on file   Number of children: Not on file   Years of education: Not on file   Highest education level: Not on file  Occupational History   Not on file  Tobacco Use   Smoking status: Never   Smokeless tobacco: Never  Substance and Sexual Activity   Alcohol use: No   Drug use: No   Sexual activity: Not on file  Other Topics Concern   Not on file  Social History Narrative   Not on file   Social Drivers of Health   Financial Resource Strain: Low Risk (02/11/2020)   Received from [facility]   Overall Financial Resource Strain (CARDIA)    Difficulty of Paying  Living Expenses: Not very hard  Food Insecurity: No Food Insecurity (03/24/2024)   Received from [facility]   Hunger Vital Sign    Within the past 12 months, you worried that your food would run out before you got the money to buy more.: Never true    Within the past 12 months, the food you bought just didn't last and you didn't have money to get more.: Never true  Transportation Needs: No Transportation Needs (02/11/2020)   Received from [facility] - Transportation    Lack of Transportation (Medical): No    Lack of Transportation (Non-Medical): No  Physical  Activity: Not on file  Stress: Not on file  Social Connections: Not on file  Intimate Partner Violence: Not on file     FAMILY HISTORY:  History reviewed. No pertinent family history.  Otherwise negative.   REVIEW OF SYSTEMS:  Review of Systems  Constitutional:  Negative for chills and fever.  Respiratory:  Negative for shortness of breath and wheezing.   Cardiovascular:  Negative for chest pain and palpitations.  Gastrointestinal:  Positive for abdominal pain, nausea and vomiting. Negative for constipation and diarrhea.    VITAL SIGNS:  Temp:  [97.5 F (36.4 C)-98.4 F (36.9 C)] 97.5 F (36.4 C) (11/11 0835) Pulse Rate:  [65-79] 65 (11/11 0835) Resp:  [19] 19 (11/11 0835) BP: (122-151)/(83-111) 122/83 (11/11 0835) SpO2:  [100 %] 100 % (11/11 0835) Weight:  [129.3 kg] 129.3 kg (11/11 0255)     Height: 5' 5 (165.1 cm) Weight: 129.3 kg BMI (Calculated): 47.43   PHYSICAL EXAM:  Physical Exam Constitutional:      Appearance: She is well-developed.  Cardiovascular:     Rate and Rhythm: Normal rate and regular rhythm.  Pulmonary:     Effort: Pulmonary effort is normal.     Breath sounds: Normal breath sounds.  Abdominal:     General: Abdomen is flat.     Palpations: Abdomen is soft.     Tenderness: There is abdominal tenderness in the right upper quadrant and epigastric area.  Neurological:     Mental Status: She is alert.     INTAKE/OUTPUT:  This shift: Total I/O In: 298.4 [IV Piggyback:298.4] Out: -   Last 2 shifts: @IOLAST2SHIFTS @  Labs:     Latest Ref Rng & Units 08/25/2024    2:59 AM 04/07/2024    4:32 PM 03/30/2023   11:15 AM  CBC  WBC 4.0 - 10.5 K/uL 14.4  10.2  11.7   Hemoglobin 12.0 - 15.0 g/dL 87.0  86.5  87.1   Hematocrit 36.0 - 46.0 % 40.5  42.2  40.5   Platelets 150 - 400 K/uL 354  285  380       Latest Ref Rng & Units 08/25/2024    2:59 AM 04/07/2024    4:32 PM 03/30/2023   11:15 AM  CMP  Glucose 70 - 99 mg/dL 877  896  888   BUN 6 - 20  mg/dL 10  9  11    Creatinine 0.44 - 1.00 mg/dL 9.50  9.35  9.42   Sodium 135 - 145 mmol/L 139  133  137   Potassium 3.5 - 5.1 mmol/L 3.8  4.0  4.0   Chloride 98 - 111 mmol/L 106  100  104   CO2 22 - 32 mmol/L 22  23  22    Calcium 8.9 - 10.3 mg/dL 8.9  9.2  9.1   Total Protein 6.5 -  8.1 g/dL 7.9  8.4  8.0   Total Bilirubin 0.0 - 1.2 mg/dL 0.4  0.7  0.5   Alkaline Phos 38 - 126 U/L 92  98  93   AST 15 - 41 U/L 30  30  16    ALT 0 - 44 U/L 39  43  29    Imaging studies:   EXAM: Right Upper Quadrant Abdominal Ultrasound 08/25/2024 05:24:00 AM   TECHNIQUE: Real-time ultrasonography of the right upper quadrant of the abdomen was performed.   COMPARISON: None available.   CLINICAL HISTORY: ruq pain   FINDINGS:   LIVER: The liver is diffusely attenuating consistent with fatty replacement, which was seen previously.   The hepatic portal vein caliber and directional flow are normal. No intrahepatic biliary ductal dilatation. No evidence of mass.   BILIARY SYSTEM: The gallbladder is mildly dilated, approaching 11 cm in length. There is no wall thickening, but there is a 1.6 cm stone lodged in the gallbladder neck and there is a positive sonographic Murphy's sign.   There is no pericholecystic fluid. The common bile duct is of normal caliber, measuring 2.4 mm.   OTHER: No right upper quadrant ascites.   IMPRESSION: 1. Gallbladder dilation with a 1.6 cm neck stone and positive sonographic Murphy sign, without wall thickening or pericholecystic fluid. Findings are equivocal for cholecystitis. Consultation with a surgeon is recommended. 2. Hepatic steatosis. 3. No biliary dilatation.   Electronically signed by: Francis Quam MD 08/25/2024 05:49 AM EST RP Workstation: [id number]  Assessment/Plan:  18 y.o. female with acute cholecystitis, complicated by pertinent comorbidities including history acid reflux and obesity.    - Given patient's clinical presentation and imaging  findings consistent with acute cholecystitis, would recommend surgery during this admission. Surgery would also prevent risk of future complications including choledocholithiasis, acute cholangitis, and gallstone pancreatitis. Patient understands and agrees to proceed with surgery.  - Discussed robotic assisted laparoscopic cholecystectomy.  Patient scheduled to have surgery this afternoon.  - Continue NPO for surgery  - Continue IV Cipro and Flagyl  - Continue pain management  -- Girolamo Lortie Barrientos PA-C

## 2024-08-25 NOTE — Discharge Instructions (Signed)

## 2024-08-25 NOTE — ED Triage Notes (Signed)
 Pt reports upper abd discomfort and nausea that began earlier tonight, pt reports hx GERD

## 2024-08-25 NOTE — Op Note (Signed)
 Preoperative diagnosis: Acute cholecystitis  Postoperative diagnosis: Same  Procedure: Robotic Assisted Laparoscopic Cholecystectomy.   Anesthesia: GETA   Surgeon: Dr. Cesar Coe  Wound Classification: Clean Contaminated  Indications: Patient is a 18 y.o. female developed right upper quadrant pain, leukocytosis and on workup was found to have cholelithiasis with cholecystitis. Robotic Assisted Laparoscopic cholecystectomy was elected.  Findings: -Intrahepatic gallbladder with severe dilation and pericholecystic edema Adequate hemostasis       Description of procedure: The patient was placed on the operating table in the supine position. General anesthesia was induced. A time-out was completed verifying correct patient, procedure, site, positioning, and implant(s) and/or special equipment prior to beginning this procedure. An orogastric tube was placed. The abdomen was prepped and draped in the usual sterile fashion.  An incision was made in a natural skin line below the umbilicus.  The fascia was elevated and the Veress needle inserted. Proper position was confirmed by aspiration and saline meniscus test.  The abdomen was insufflated with carbon dioxide to a pressure of 15 mmHg. The patient tolerated insufflation well. A 8-mm trocar was then inserted in optiview fashion.  The laparoscope was inserted and the abdomen inspected. No injuries from initial trocar placement were noted. Additional trocars were then inserted in the following locations: an 8-mm trocar in the left lateral abdomen, and another two 8-mm trocars to the right side of the abdomen 5 cm appart. The umbilical trocar was changed to a 12 mm trocar all under direct visualization. The abdomen was inspected and no abnormalities were found. The table was placed in the reverse Trendelenburg position with the right side up. The robotic arms were docked and target anatomy identified. Instrument inserted under direct visualization.   Filmy adhesions between the gallbladder and omentum, duodenum and transverse colon were lysed with electrocautery. The dome of the gallbladder was grasped with a prograsp and retracted over the dome of the liver.  Due to severe distention, gallbladder also very tense unable to be grasped, I needed to open the gallbladder and suction the bilious content.  With this maneuver, the infundibulum was also grasped with an atraumatic grasper and retracted toward the right lower quadrant.  Due to the severe inflammation and intrahepatic location, I was unable to easily achieve a critical view of safety so I decided to proceed with a difficult and time-consuming dome down dissection of the gallbladder.  With this maneuver I was able to identify the cystic duct and cystic artery. Firefly images taken to visualize biliary ducts. The cystic duct and cystic artery were then doubly clipped and divided close to the gallbladder.  Hemostasis was checked and the gallbladder and contained stones were removed using an endoscopic retrieval bag. The gallbladder was passed off the table as a specimen. The gallbladder fossa was copiously irrigated with saline and hemostasis was obtained. There was no evidence of bleeding from the gallbladder fossa or cystic artery or leakage of the bile from the cystic duct stump. Secondary trocars were removed under direct vision. No bleeding was noted. The robotic arms were undoked. The scope was withdrawn and the umbilical trocar removed. The abdomen was allowed to collapse. The fascia of the 12mm trocar sites was closed with figure-of-eight 0 vicryl sutures. The skin was closed with subcuticular sutures of 4-0 monocryl and topical skin adhesive. The orogastric tube was removed.  The patient tolerated the procedure well and was taken to the postanesthesia care unit in stable condition.   Specimen: Gallbladder  Complications: None  EBL: 10  mL

## 2024-08-25 NOTE — ED Notes (Signed)
 Pt found to be sitting on the floor, pt reports the floor is where she feels most comfortable. RN offered to position pt for comfort in bed, pt declined, stating she prefers to sit on the floor.

## 2024-08-25 NOTE — ED Provider Notes (Signed)
 [facility] Provider Note    Event Date/Time   First MD Initiated Contact with Patient 08/25/24 [phone]     (approximate)   History   Chief Complaint: Abdominal Pain   HPI  Deborah Wang is a 18 y.o. female with a history of obesity, gallstones who comes ED complaining of right upper quadrant abdominal pain that started around 8:00 PM tonight after eating dinner.  Does report a history of GERD but this feels different.  No chest pain or shortness of breath, no fever.        Past Medical History:  Diagnosis Date   Abdominal pain, epigastric    Asthma    Constipation, chronic    Gallstones    Hematochezia    Obesity    Prediabetes    H/O NO MEDS    Current Outpatient Rx   Order #: [id number] Class: Historical Med   Order #: [id number] Class: Normal   Order #: [id number] Class: Normal    Past Surgical History:  Procedure Laterality Date   TONSILLECTOMY AND ADENOIDECTOMY Bilateral 04/15/2018   Procedure: TONSILLECTOMY AND ADENOIDECTOMY;  Surgeon: Herminio Miu, MD;  Location: [facility] ORS;  Service: ENT;  Laterality: Bilateral;    Physical Exam   Triage Vital Signs: ED Triage Vitals [08/25/24 0255]  Encounter Vitals Group     BP (!) 151/111     Girls Systolic BP Percentile      Girls Diastolic BP Percentile      Boys Systolic BP Percentile      Boys Diastolic BP Percentile      Pulse Rate 79     Resp 19     Temp 98.4 F (36.9 C)     Temp src      SpO2 100 %     Weight 285 lb (129.3 kg)     Height 5' 5 (1.651 m)     Head Circumference      Peak Flow      Pain Score 10     Pain Loc      Pain Education      Exclude from Growth Chart     Most recent vital signs: Vitals:   08/25/24 0255  BP: (!) 151/111  Pulse: 79  Resp: 19  Temp: 98.4 F (36.9 C)  SpO2: 100%    General: Awake, no distress.  CV:  Good peripheral perfusion.  Resp:  Normal effort.  Abd:  No distention.  Soft with epigastric and right upper quadrant  tenderness Other:     ED Results / Procedures / Treatments   Labs (all labs ordered are listed, but only abnormal results are displayed) Labs Reviewed  COMPREHENSIVE METABOLIC PANEL WITH GFR - Abnormal; Notable for the following components:      Result Value   Glucose, Bld 122 (*)    All other components within normal limits  CBC - Abnormal; Notable for the following components:   WBC 14.4 (*)    MCV 79.7 (*)    MCH 25.4 (*)    All other components within normal limits  LIPASE, BLOOD  URINALYSIS, ROUTINE W REFLEX MICROSCOPIC  POC URINE PREG, ED     EKG    RADIOLOGY Ultrasound right upper quadrant interpreted by me, shows cholelithiasis with distended gallbladder.  Radiology report reviewed   PROCEDURES:  Procedures   MEDICATIONS ORDERED IN ED: Medications  naproxen (NAPROSYN) tablet 500 mg (500 mg Oral Given 08/25/24 0552)  calcium carbonate (TUMS - dosed in mg elemental calcium)  chewable tablet 200 mg of elemental calcium (200 mg of elemental calcium Oral Given 08/25/24 0552)     IMPRESSION / MDM / ASSESSMENT AND PLAN / ED COURSE  I reviewed the triage vital signs and the nursing notes.  DDx: Gastritis, pancreatitis, biliary colic, cholecystitis, AKI, electrolyte derangement  Patient's presentation is most consistent with acute presentation with potential threat to life or bodily function.  Patient presents with upper abdominal pain suspicious for biliary colic.  Will obtain ultrasound and labs.   ----------------------------------------- 6:37 AM on 08/25/2024 ----------------------------------------- Ultrasound shows gallstone impacted in the gallbladder neck with distended gallbladder, sonographic Murphy sign.  Patient has leukocytosis, persistent severe pain.  Discussed with surgery Dr. Cesar who will admit for surgery.      FINAL CLINICAL IMPRESSION(S) / ED DIAGNOSES   Final diagnoses:  Acute cholecystitis  Morbid obesity (HCC)     Rx / DC  Orders   ED Discharge Orders     None        Note:  This document was prepared using Dragon voice recognition software and may include unintentional dictation errors.   Viviann Pastor, MD 08/25/24 [phone]

## 2024-08-25 NOTE — Progress Notes (Signed)
 PHARMACIST - PHYSICIAN COMMUNICATION  CONCERNING:  Enoxaparin (Lovenox) for DVT Prophylaxis    RECOMMENDATION: Patient was prescribed enoxaprin 40mg  q24 hours for VTE prophylaxis.   Filed Weights   08/25/24 0255  Weight: 129.3 kg (285 lb)    Body mass index is 47.43 kg/m.  Estimated Creatinine Clearance: 153.4 mL/min (by C-G formula based on SCr of 0.49 mg/dL).   Based on [facility] policy patient is candidate for enoxaparin 0.5mg /kg TBW SQ every 24 hours based on BMI being >30.  DESCRIPTION: Pharmacy has adjusted enoxaparin dose per [facility] policy.  Patient is now receiving enoxaparin 0.5 mg/kg every 24 hours   Rankin CANDIE Dills, PharmD, [facility] 08/25/2024 6:44 AM

## 2024-08-26 ENCOUNTER — Encounter: Payer: Self-pay | Admitting: General Surgery

## 2024-08-26 MED ORDER — HYDROCODONE-ACETAMINOPHEN 5-325 MG PO TABS: 1.0000 | ORAL_TABLET | Freq: Three times a day (TID) | ORAL | 0 refills | Status: AC | PRN

## 2024-08-26 NOTE — Discharge Summary (Signed)
 [facility]-General Surgery  SURGICAL DISCHARGE SUMMARY  Patient ID: Deborah Wang MRN: [id number] DOB/AGE: [birth year] 18 y.o.  Admit date: 08/25/2024 Discharge date: 08/26/2024  Discharge Diagnoses Patient Active Problem List   Diagnosis Date Noted   Acute cholecystitis 08/25/2024   Abdominal pain, epigastric    Constipation, chronic    Hematochezia     Consultants None   Procedures Robotic Assisted Laparoscopic Cholecystectomy    Hospital Course:  Patient presented to the [facility] ED on 08/25/2024 with right upper abdominal pain.  In the ED, patient was afebrile and normotensive with BP 121/70 and HR of 134.  Labs indicated leukocytosis 14.4.  CMP indicated normal LFTs, alkaline phos, and total bili.  No electrolyte disturbance noted.  Lipase normal at 40.  Ultrasound of abdomen showed gallbladder dilation with a 1.6 cm neck stone and positive Murphy sign.  No gallbladder wall thickening or pericholecystic fluid.  CBD measuring 2.4 mm.  Findings consistent with acute cholecystitis.  No concerns for choledocholithiasis or acute cholangitis.  Patient was taken to the operating room on later that afternoon for robotic assisted laparoscopic cholecystectomy. Surgery went well. Patient tolerated procedure.  Patient is now tolerating diet post-op and is ambulating. Surgical incisions show no signs of infection. Pain is well-controlled with pain medication. Patient is clear from surgical standpoint.  Patient will follow-up outpatient with Dr. Cesar in 2 weeks.     Physical Examination:  Constitutional: alert, in no acute distress Pulmonary: CTA bilaterally, normal breath sounds Cardiac: regular rate and rhythm Gastrointestinal: soft, tender on LLQ, and non-distended, surgical incisions are clean and dry, surgical glue intact - no signs of infection    Allergies as of 08/26/2024       Reactions   Amoxicillin Rash        Medication List     TAKE these medications     albuterol 108 (90 Base) MCG/ACT inhaler Commonly known as: VENTOLIN HFA Inhale 2 puffs into the lungs every 6 (six) hours as needed for wheezing or shortness of breath.   cyclobenzaprine  5 MG tablet Commonly known as: FLEXERIL  Take 1 tablet (5 mg total) by mouth 3 (three) times daily as needed for muscle spasms.   HYDROcodone -acetaminophen  5-325 MG tablet Commonly known as: NORCO/VICODIN Take 1 tablet by mouth every 8 (eight) hours as needed for up to 3 days.   liraglutide 18 MG/3ML Sopn Commonly known as: VICTOZA Inject 0.1 mLs into the skin once a week.   ondansetron  4 MG disintegrating tablet Commonly known as: ZOFRAN -ODT Take 1 tablet (4 mg total) by mouth every 8 (eight) hours as needed for nausea or vomiting.   sertraline 50 MG tablet Commonly known as: ZOLOFT Take 50 mg by mouth daily.          Follow-up Information     Rodolph Romano, MD Follow up in 2 week(s).   Specialty: General Surgery Why: 2 weeks s/p robotic assisted lap cholecystectomy Contact information: [address] [place] [address] [phone]                  Time spent on discharge management including discussion of hospital course, clinical condition, outpatient instructions, prescriptions, and follow up with the patient and members of the medical team: >30 minutes  Douglas Smolinsky Barrientos PA-C

## 2024-08-26 NOTE — Anesthesia Postprocedure Evaluation (Signed)
 Anesthesia Post Note  Patient: Deborah Wang  Procedure(s) Performed: CHOLECYSTECTOMY, ROBOT-ASSISTED, LAPAROSCOPIC INDOCYANINE GREEN FLUORESCENCE IMAGING (ICG)  Patient location during evaluation: PACU Anesthesia Type: General Level of consciousness: awake and alert Pain management: pain level controlled Vital Signs Assessment: post-procedure vital signs reviewed and stable Respiratory status: spontaneous breathing, nonlabored ventilation and respiratory function stable Cardiovascular status: blood pressure returned to baseline and stable Postop Assessment: no apparent nausea or vomiting Anesthetic complications: no   No notable events documented.   Last Vitals:  Vitals:   08/26/24 0709 08/26/24 0744  BP: 129/86 115/80  Pulse: 87 81  Resp: 16   Temp: 36.7 C 36.9 C  SpO2: 98% 97%    Last Pain:  Vitals:   08/26/24 0744  TempSrc: Oral  PainSc:                  Fairy POUR Tinzlee Craker

## 2024-08-26 NOTE — TOC CM/SW Note (Signed)
 Transition of Care [facility]) - Inpatient Brief Assessment   Patient Details  Name: Deborah Wang MRN: [id number] Date of Birth: 2006-01-13  Transition of Care [facility]) CM/SW Contact:    Corean ONEIDA Haddock, RN Phone Number: 08/26/2024, 10:01 AM   Clinical Narrative:   Transition of Care (TOC) Screening Note   Patient Details  Name: Deborah Wang Date of Birth: [birth year]   Transition of Care (TOC) CM/SW Contact:    Corean ONEIDA Haddock, RN Phone Number: 08/26/2024, 10:01 AM    Transition of Care Department [facility]) has reviewed patient and no TOC needs have been identified at this time.  If new patient transition needs arise, please place a TOC consult.    Transition of Care Asessment: Insurance and Status: Insurance coverage has been reviewed Patient has primary care physician: Yes     Prior/Current Home Services: No current home services Social Drivers of Health Review: SDOH reviewed no interventions necessary Readmission risk has been reviewed: No (obs status. no score generated) Transition of care needs: no transition of care needs at this time

## 2024-08-27 LAB — SURGICAL PATHOLOGY
